# Patient Record
Sex: Female | Born: 2000 | Race: Black or African American | Hispanic: No | Marital: Single | Smoking: Never smoker
Health system: Southern US, Community
[De-identification: ages and names within clinical notes are randomized; demographics above are authoritative.]

## PROBLEM LIST (undated history)

## (undated) DIAGNOSIS — J45909 Unspecified asthma, uncomplicated: Secondary | ICD-10-CM

## (undated) HISTORY — PX: SPINAL FUSION: SHX223

---
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
## 2019-05-31 ENCOUNTER — Other Ambulatory Visit: Payer: Self-pay

## 2019-05-31 ENCOUNTER — Emergency Department (HOSPITAL_COMMUNITY)
Admission: EM | Admit: 2019-05-31 | Discharge: 2019-06-01 | Disposition: A | Discharge status: Home / Self Care | Payer: BLUE CROSS/BLUE SHIELD | Attending: Emergency Medicine | Admitting: Emergency Medicine

## 2019-05-31 ENCOUNTER — Emergency Department (HOSPITAL_COMMUNITY): Payer: BLUE CROSS/BLUE SHIELD

## 2019-05-31 ENCOUNTER — Encounter (HOSPITAL_COMMUNITY): Payer: Self-pay | Admitting: *Deleted

## 2019-05-31 DIAGNOSIS — Y999 Unspecified external cause status: Secondary | ICD-10-CM | POA: Insufficient documentation

## 2019-05-31 DIAGNOSIS — J45909 Unspecified asthma, uncomplicated: Secondary | ICD-10-CM | POA: Insufficient documentation

## 2019-05-31 DIAGNOSIS — M545 Low back pain: Secondary | ICD-10-CM | POA: Insufficient documentation

## 2019-05-31 DIAGNOSIS — M79602 Pain in left arm: Secondary | ICD-10-CM | POA: Diagnosis not present

## 2019-05-31 DIAGNOSIS — Y92410 Unspecified street and highway as the place of occurrence of the external cause: Secondary | ICD-10-CM | POA: Diagnosis not present

## 2019-05-31 DIAGNOSIS — Y939 Activity, unspecified: Secondary | ICD-10-CM | POA: Diagnosis not present

## 2019-05-31 LAB — POC URINE PREG, ED: Preg Test, Ur: NEGATIVE

## 2019-05-31 MED ORDER — ACETAMINOPHEN 325 MG PO TABS
650.0000 mg | ORAL_TABLET | Freq: Once | ORAL | Status: AC
  Administered 2019-05-31: 22:00:00 650 mg via ORAL
  Filled 2019-05-31: qty 2

## 2019-05-31 NOTE — ED Notes (Signed)
Patient transported to X-ray 
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
## 2019-05-31 NOTE — Discharge Instructions (Addendum)
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
## 2019-06-01 NOTE — ED Notes (Signed)
Patient verbalizes understanding of discharge instructions. Opportunity for questioning and answers were provided. Armband removed by staff, pt discharged from ED.  

## 2020-06-15 ENCOUNTER — Ambulatory Visit: Payer: BLUE CROSS/BLUE SHIELD | Attending: Critical Care Medicine

## 2020-06-15 DIAGNOSIS — Z23 Encounter for immunization: Secondary | ICD-10-CM

## 2020-06-15 NOTE — Progress Notes (Signed)
   Covid-19 Vaccination Clinic  Name:  Lacye Mccarn    MRN: [id number] DOB: 12-31-2000  06/15/2020  Ms. Arnette was observed post Covid-19 immunization for 15 minutes without incident. She was provided with Vaccine Information Sheet and instruction to access the V-Safe system.   Ms. Majewski was instructed to call 911 with any severe reactions post vaccine: Marland Kitchen Difficulty breathing  . Swelling of face and throat  . A fast heartbeat  . A bad rash all over body  . Dizziness and weakness   Immunizations Administered    Name Date Dose VIS Date Route   Pfizer COVID-19 Vaccine 06/15/2020  2:15 PM 0.3 mL 12/10/2018 Intramuscular   Manufacturer: [employer], [employer]   Lot: [id number]   NDC: [address]-4

## 2020-07-06 ENCOUNTER — Ambulatory Visit: Payer: BLUE CROSS/BLUE SHIELD | Attending: Internal Medicine

## 2020-07-06 DIAGNOSIS — Z23 Encounter for immunization: Secondary | ICD-10-CM

## 2020-07-06 NOTE — Progress Notes (Signed)
   Covid-19 Vaccination Clinic  Name:  Kianni Lheureux    MRN: [id number] DOB: 2000-12-01  07/06/2020  Ms. Rabadan was observed post Covid-19 immunization for 15 minutes without incident. She was provided with Vaccine Information Sheet and instruction to access the V-Safe system.   Ms. Morash was instructed to call 911 with any severe reactions post vaccine: Marland Kitchen Difficulty breathing  . Swelling of face and throat  . A fast heartbeat  . A bad rash all over body  . Dizziness and weakness   Immunizations Administered    Name Date Dose VIS Date Route   Pfizer COVID-19 Vaccine 07/06/2020 12:43 PM 0.3 mL 12/10/2018 Intramuscular   Manufacturer: [employer], [employer]   Lot: [id number]   NDC: [address]-8

## 2021-09-02 ENCOUNTER — Encounter (HOSPITAL_COMMUNITY): Payer: Self-pay | Admitting: Emergency Medicine

## 2021-09-02 ENCOUNTER — Other Ambulatory Visit: Payer: Self-pay

## 2021-09-02 ENCOUNTER — Emergency Department (HOSPITAL_COMMUNITY)
Admission: EM | Admit: 2021-09-02 | Discharge: 2021-09-02 | Disposition: A | Discharge status: Home / Self Care | Payer: BLUE CROSS/BLUE SHIELD | Attending: Emergency Medicine | Admitting: Emergency Medicine

## 2021-09-02 DIAGNOSIS — Z5321 Procedure and treatment not carried out due to patient leaving prior to being seen by health care provider: Secondary | ICD-10-CM | POA: Diagnosis not present

## 2021-09-02 DIAGNOSIS — J45901 Unspecified asthma with (acute) exacerbation: Secondary | ICD-10-CM | POA: Diagnosis present

## 2021-09-02 MED ORDER — ALBUTEROL SULFATE (2.5 MG/3ML) 0.083% IN NEBU
5.0000 mg | INHALATION_SOLUTION | Freq: Once | RESPIRATORY_TRACT | Status: AC
  Administered 2021-09-02: 5 mg via RESPIRATORY_TRACT
  Filled 2021-09-02: qty 6

## 2021-09-02 NOTE — ED Triage Notes (Signed)
Patient reports asthma attack this week , she ran out of her inhaler , presents with wheezing and productive cough .

## 2021-09-02 NOTE — ED Provider Notes (Signed)
Emergency Medicine Provider Triage Evaluation Note  Lori Andrade , a 20 y.o. female  was evaluated in triage.  Pt complains of asthma.  Has had trouble all week since weather got cold.  Did treatment around 10PM but has now run out of solution and does not have inhaler at home.  Denies fever or sick contacts.  Review of Systems  Positive: Asthma, wheezing Negative: SOB, fever  Physical Exam  BP 115/83 (BP Location: Left Arm)   Pulse (!) 125   Temp 98 F (36.7 C)   Resp 16   Ht 5\' 1"  (1.549 m)   Wt 75 kg   SpO2 100%   BMI 31.24 kg/m   Gen:   Awake, no distress   Resp:  Normal effort, expiratory wheezes noted, worse on right side MSK:   Moves extremities without difficulty  Other:    Medical Decision Making  Medically screening exam initiated at 2:28 AM.  Appropriate orders placed.  was informed that the remainder of the evaluation will be completed by another provider, this initial triage assessment does not replace that evaluation, and the importance of remaining in the ED until their evaluation is complete.  Asthma.  Does have some wheezing on exam but speaking in full sentences.  Neb treatment ordered.   Ria Clock, PA-C 09/02/21 0230    09/04/21, MD 09/02/21 [phone]

## 2022-02-20 ENCOUNTER — Encounter (HOSPITAL_COMMUNITY): Payer: Self-pay | Admitting: Emergency Medicine

## 2022-02-20 ENCOUNTER — Emergency Department (HOSPITAL_COMMUNITY)
Admission: EM | Admit: 2022-02-20 | Discharge: 2022-02-20 | Disposition: A | Discharge status: Home / Self Care | Payer: BLUE CROSS/BLUE SHIELD | Attending: Emergency Medicine | Admitting: Emergency Medicine

## 2022-02-20 ENCOUNTER — Emergency Department (HOSPITAL_COMMUNITY): Payer: BLUE CROSS/BLUE SHIELD

## 2022-02-20 DIAGNOSIS — S0181XA Laceration without foreign body of other part of head, initial encounter: Secondary | ICD-10-CM | POA: Diagnosis not present

## 2022-02-20 DIAGNOSIS — Y92410 Unspecified street and highway as the place of occurrence of the external cause: Secondary | ICD-10-CM | POA: Diagnosis not present

## 2022-02-20 DIAGNOSIS — S0993XA Unspecified injury of face, initial encounter: Secondary | ICD-10-CM | POA: Diagnosis present

## 2022-02-20 MED ORDER — LIDOCAINE-EPINEPHRINE-TETRACAINE (LET) TOPICAL GEL
3.0000 mL | Freq: Once | TOPICAL | Status: AC
  Administered 2022-02-20: 3 mL via TOPICAL
  Filled 2022-02-20: qty 3

## 2022-02-20 MED ORDER — ACETAMINOPHEN 500 MG PO TABS
1000.0000 mg | ORAL_TABLET | Freq: Once | ORAL | Status: AC
  Administered 2022-02-20: 1000 mg via ORAL
  Filled 2022-02-20: qty 2
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
## 2022-02-20 NOTE — Discharge Instructions (Addendum)
Your sutures are absorbable meaning they do not need to be cut out and will fall out on their own and be absorbed by the body.  Anytime your body sustained a laceration, there is always a chance of scar formation as the laceration heals.  Okay to gently clean the wound daily with warm soapy water.  Watch for signs of developing infection.  Recommend follow-up as needed with your PCP for further management. ?
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
